# Patient Record
Sex: Male | Born: 1980 | Race: Black or African American | Hispanic: No | Marital: Single | Smoking: Never smoker
Health system: Southern US, Community
[De-identification: ages and names within clinical notes are randomized; demographics above are authoritative.]

## PROBLEM LIST (undated history)

## (undated) DIAGNOSIS — Z889 Allergy status to unspecified drugs, medicaments and biological substances status: Secondary | ICD-10-CM

## (undated) DIAGNOSIS — I10 Essential (primary) hypertension: Secondary | ICD-10-CM

## (undated) HISTORY — PX: WISDOM TOOTH EXTRACTION: SHX21

---

## 2016-03-02 ENCOUNTER — Encounter (HOSPITAL_BASED_OUTPATIENT_CLINIC_OR_DEPARTMENT_OTHER): Payer: Self-pay | Admitting: *Deleted

## 2016-03-02 ENCOUNTER — Emergency Department (HOSPITAL_BASED_OUTPATIENT_CLINIC_OR_DEPARTMENT_OTHER)

## 2016-03-02 ENCOUNTER — Emergency Department (HOSPITAL_BASED_OUTPATIENT_CLINIC_OR_DEPARTMENT_OTHER)
Admission: EM | Admit: 2016-03-02 | Discharge: 2016-03-02 | Disposition: A | Discharge status: Home / Self Care | Attending: Emergency Medicine | Admitting: Emergency Medicine

## 2016-03-02 DIAGNOSIS — S59902A Unspecified injury of left elbow, initial encounter: Secondary | ICD-10-CM | POA: Diagnosis present

## 2016-03-02 DIAGNOSIS — Y92410 Unspecified street and highway as the place of occurrence of the external cause: Secondary | ICD-10-CM | POA: Insufficient documentation

## 2016-03-02 DIAGNOSIS — I10 Essential (primary) hypertension: Secondary | ICD-10-CM | POA: Insufficient documentation

## 2016-03-02 DIAGNOSIS — Y939 Activity, unspecified: Secondary | ICD-10-CM | POA: Insufficient documentation

## 2016-03-02 DIAGNOSIS — Z79899 Other long term (current) drug therapy: Secondary | ICD-10-CM | POA: Diagnosis not present

## 2016-03-02 DIAGNOSIS — Y999 Unspecified external cause status: Secondary | ICD-10-CM | POA: Insufficient documentation

## 2016-03-02 DIAGNOSIS — S53402A Unspecified sprain of left elbow, initial encounter: Secondary | ICD-10-CM

## 2016-03-02 HISTORY — DX: Essential (primary) hypertension: I10

## 2016-03-02 HISTORY — DX: Allergy status to unspecified drugs, medicaments and biological substances: Z88.9

## 2016-03-02 MED ORDER — IBUPROFEN 800 MG PO TABS
800.0000 mg | ORAL_TABLET | Freq: Once | ORAL | Status: AC
  Administered 2016-03-02: 800 mg via ORAL
  Filled 2016-03-02: qty 1

## 2016-03-02 NOTE — ED Notes (Signed)
MD at bedside. 
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
## 2016-03-02 NOTE — ED Notes (Signed)
Pt given specimen cup and made aware that MD wants a sample to view for gross blood

## 2016-03-02 NOTE — ED Notes (Signed)
Pt ambulatory to d/c window without difficulty
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
